# Patient Record
Sex: Male | Born: 1960 | Race: White | Hispanic: No | Smoking: Heavy tobacco smoker
Health system: Southern US, Community
[De-identification: ages and names within clinical notes are randomized; demographics above are authoritative.]

## PROBLEM LIST (undated history)

## (undated) DIAGNOSIS — I10 Essential (primary) hypertension: Secondary | ICD-10-CM

---

## 2014-06-21 ENCOUNTER — Ambulatory Visit: Payer: Self-pay | Admitting: Internal Medicine

## 2015-06-06 ENCOUNTER — Ambulatory Visit
Admission: RE | Admit: 2015-06-06 | Discharge: 2015-06-06 | Disposition: A | Discharge status: Home / Self Care | Source: Ambulatory Visit | Attending: Otolaryngology | Admitting: Otolaryngology

## 2015-06-06 ENCOUNTER — Other Ambulatory Visit: Payer: Self-pay | Admitting: Otolaryngology

## 2015-06-06 DIAGNOSIS — R05 Cough: Secondary | ICD-10-CM

## 2015-06-06 DIAGNOSIS — R059 Cough, unspecified: Secondary | ICD-10-CM

## 2015-12-06 ENCOUNTER — Encounter: Payer: Self-pay | Admitting: Emergency Medicine

## 2015-12-06 DIAGNOSIS — Z5321 Procedure and treatment not carried out due to patient leaving prior to being seen by health care provider: Secondary | ICD-10-CM | POA: Diagnosis not present

## 2015-12-06 DIAGNOSIS — R040 Epistaxis: Secondary | ICD-10-CM | POA: Diagnosis present

## 2015-12-06 NOTE — ED Notes (Signed)
Pt arrived to the ED for bleeding from a tiny cut next to the right nair. Pt is AOx4 in no apparent distress with very little bleeding.

## 2015-12-07 ENCOUNTER — Emergency Department
Admission: EM | Admit: 2015-12-07 | Discharge: 2015-12-07 | Disposition: A | Discharge status: Home / Self Care | Attending: Emergency Medicine | Admitting: Emergency Medicine

## 2015-12-07 HISTORY — DX: Essential (primary) hypertension: I10

## 2017-09-19 IMAGING — CR DG CHEST 2V
2 series · 2 of 2 positions shown · non-contrast
Comparison: None.

CLINICAL DATA: Persistent cough.

EXAM:
CHEST  2 VIEW

[chest pa]
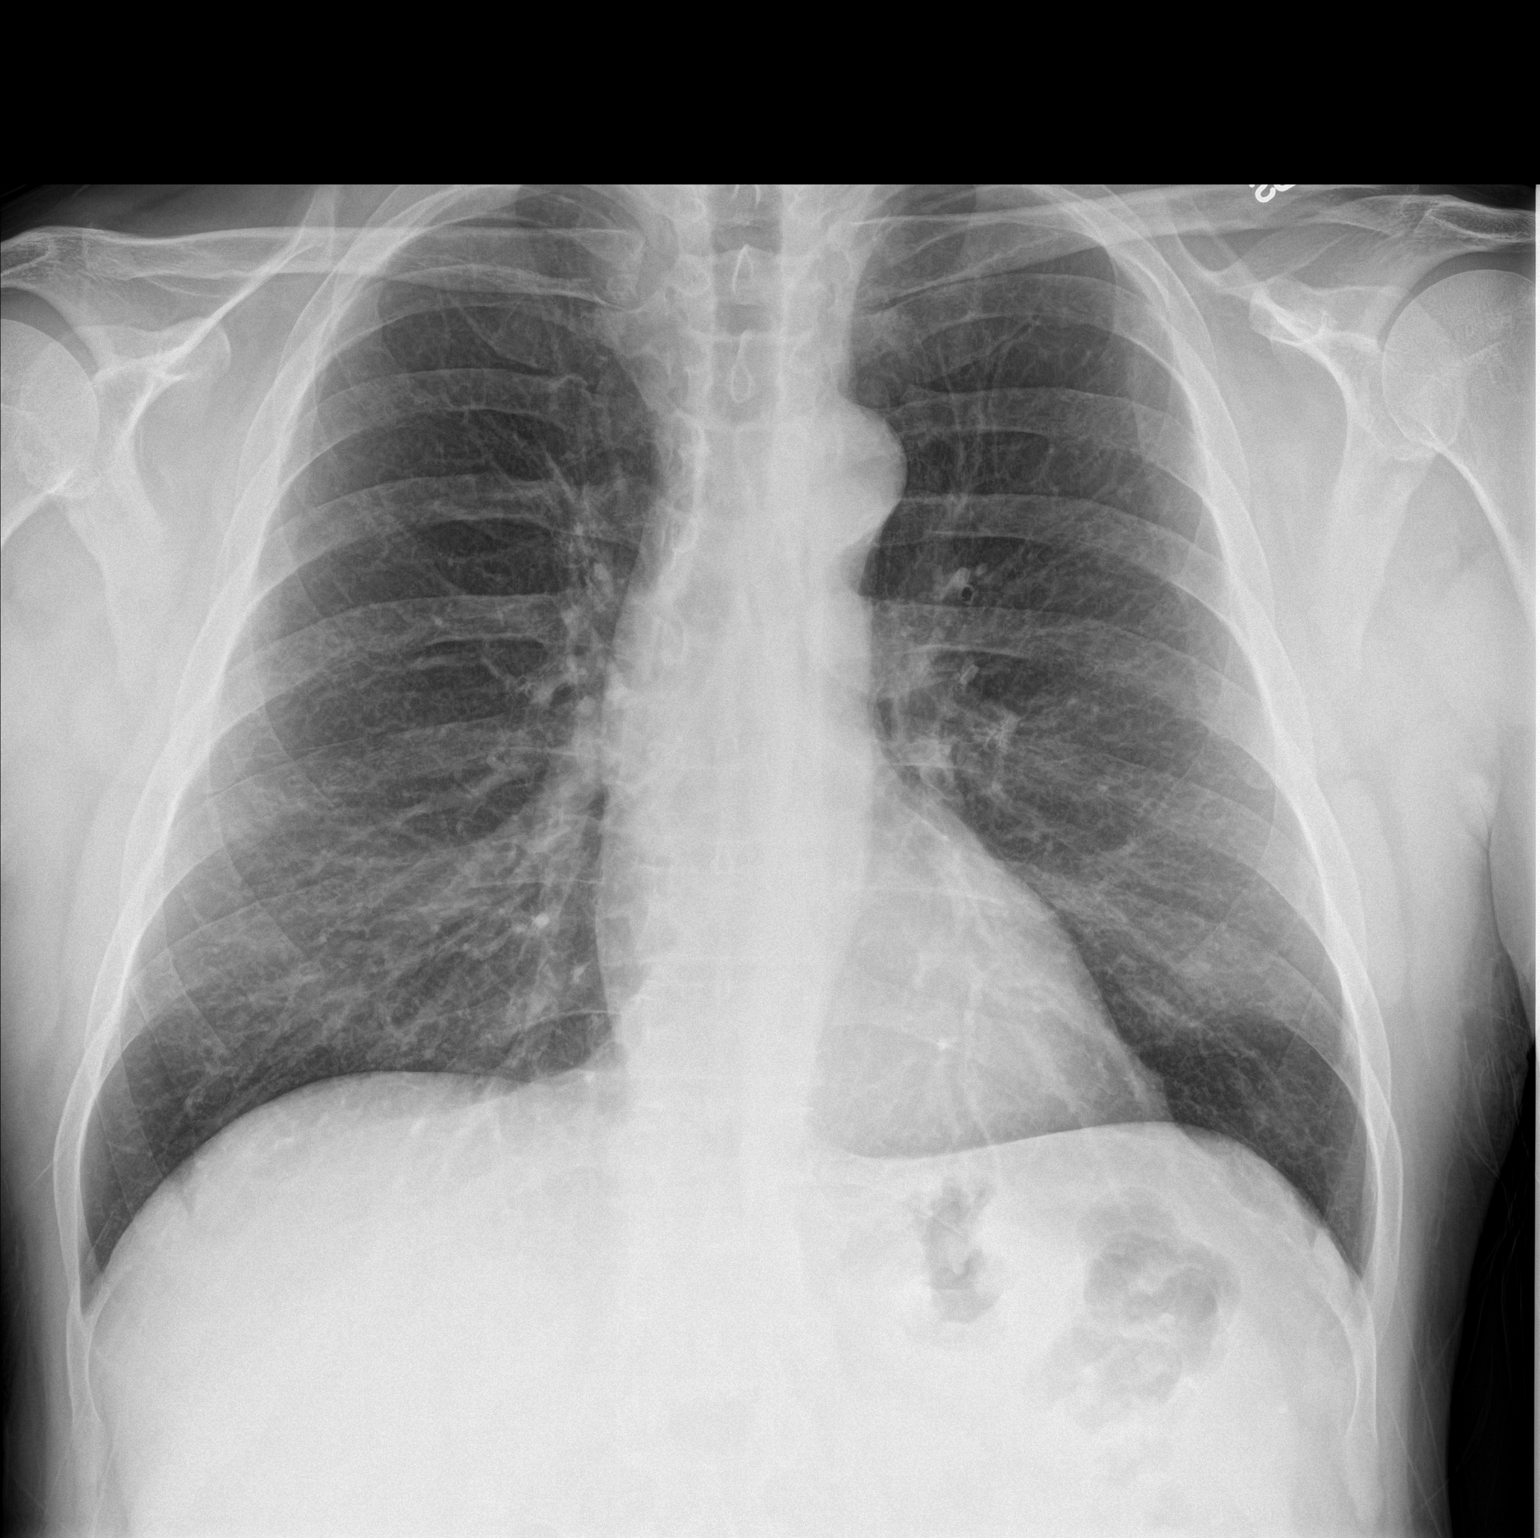

[chest lat]
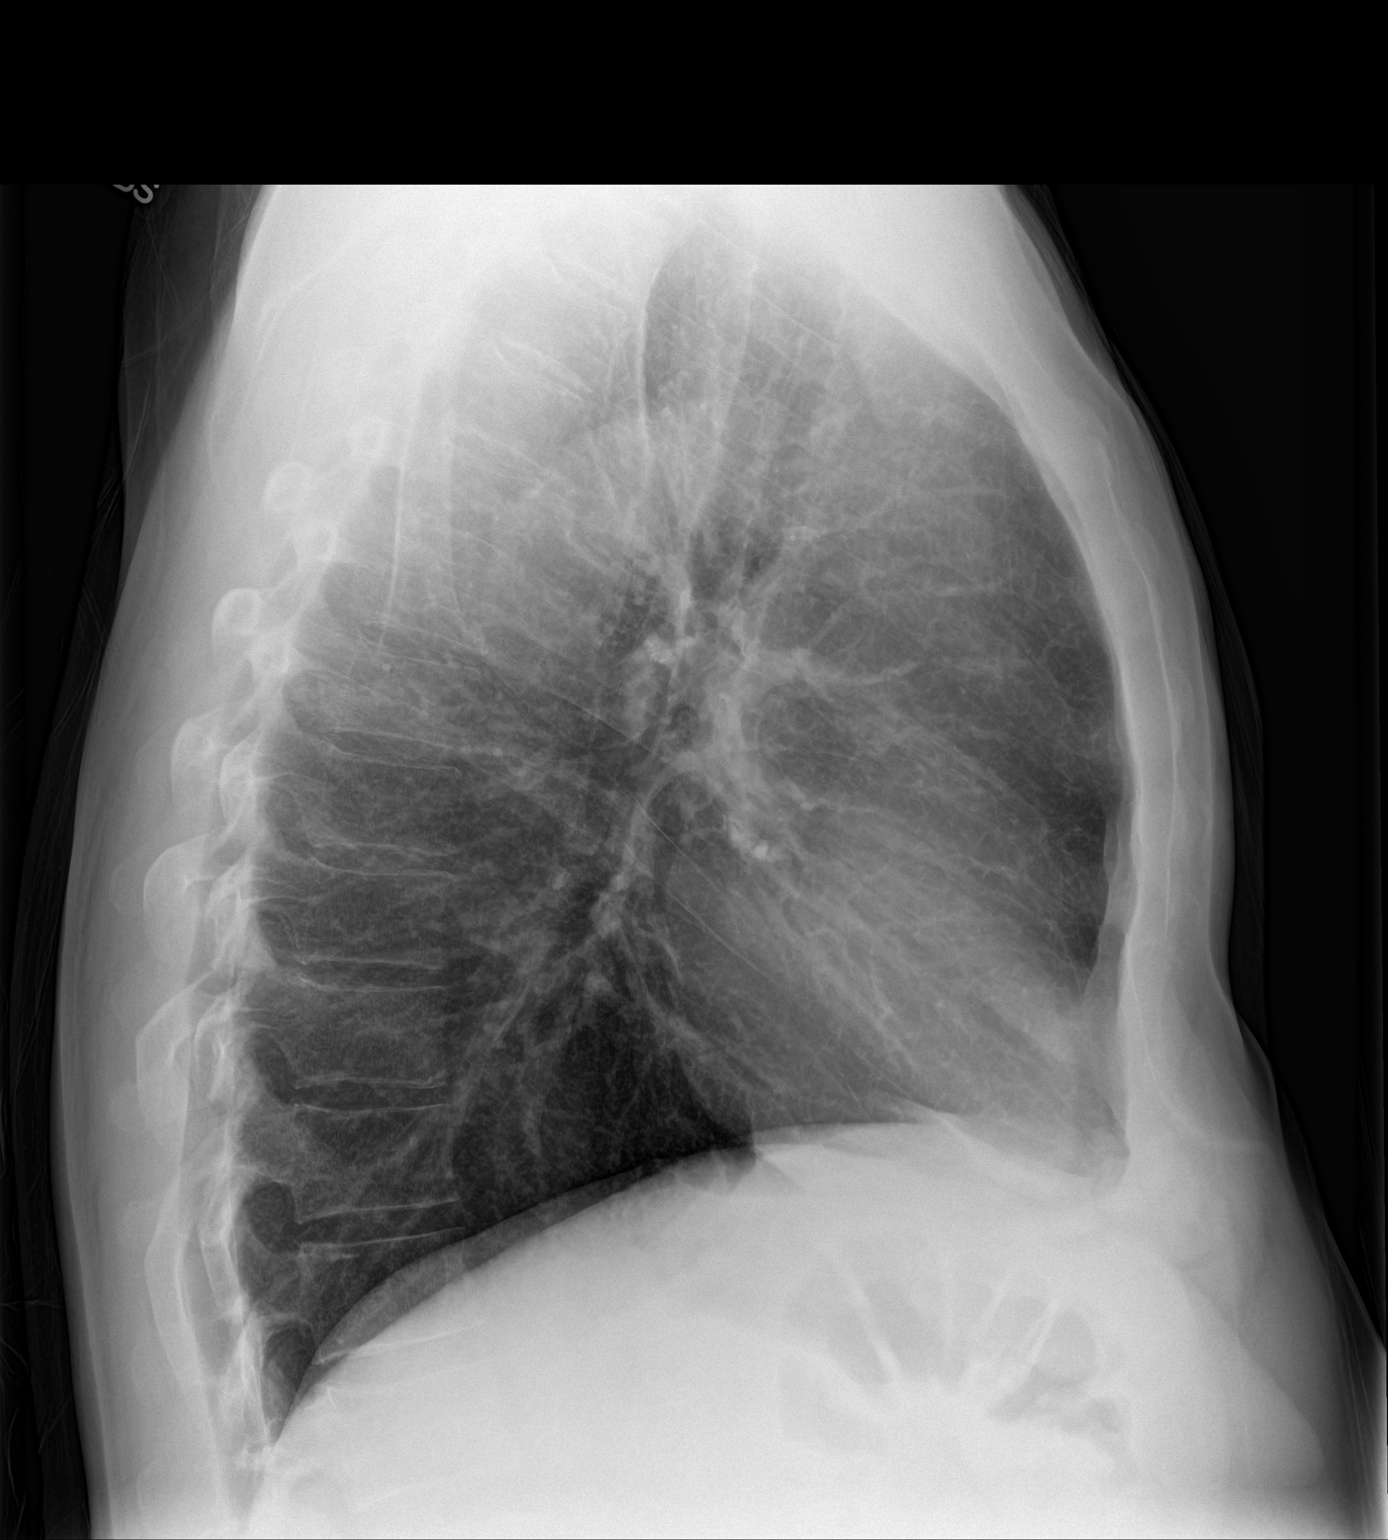

[2 of 2 positions shown; findings below may reference images not displayed]

FINDINGS: Mediastinum hilar structures normal. Lungs are clear. Symmetric
solitary nodular densities noted projected over the lung bases on PA
view only, consistent with nipple shadows. No pleural effusion or
pneumothorax. Heart size normal. No acute bony abnormality.
IMPRESSION: No acute abnormality.

## 2024-07-27 DEATH — deceased
